# Patient Record
Sex: Female | Born: 1950 | Race: White | Hispanic: No | Marital: Married
Health system: Southern US, Community
[De-identification: ages and names within clinical notes are randomized; demographics above are authoritative.]

---

## 2006-10-29 ENCOUNTER — Emergency Department (HOSPITAL_COMMUNITY): Admission: EM | Admit: 2006-10-29 | Discharge: 2006-10-29 | Payer: Self-pay | Admitting: Family Medicine

## 2007-02-25 ENCOUNTER — Other Ambulatory Visit: Admission: RE | Admit: 2007-02-25 | Discharge: 2007-02-25 | Payer: Self-pay | Admitting: Family Medicine

## 2009-09-04 ENCOUNTER — Other Ambulatory Visit: Admission: RE | Admit: 2009-09-04 | Discharge: 2009-09-04 | Payer: Self-pay | Admitting: Family Medicine

## 2015-03-10 ENCOUNTER — Other Ambulatory Visit: Payer: Self-pay | Admitting: Family Medicine

## 2015-03-10 DIAGNOSIS — N838 Other noninflammatory disorders of ovary, fallopian tube and broad ligament: Secondary | ICD-10-CM

## 2015-03-16 ENCOUNTER — Ambulatory Visit
Admission: RE | Admit: 2015-03-16 | Discharge: 2015-03-16 | Disposition: A | Discharge status: Home / Self Care | Payer: BLUE CROSS/BLUE SHIELD | Source: Ambulatory Visit | Attending: Family Medicine | Admitting: Family Medicine

## 2015-03-16 DIAGNOSIS — N838 Other noninflammatory disorders of ovary, fallopian tube and broad ligament: Secondary | ICD-10-CM

## 2015-03-16 MED ORDER — IOPAMIDOL (ISOVUE-300) INJECTION 61%
100.0000 mL | Freq: Once | INTRAVENOUS | Status: AC | PRN
  Administered 2015-03-16: 100 mL via INTRAVENOUS

## 2015-03-17 ENCOUNTER — Other Ambulatory Visit: Payer: Self-pay

## 2016-11-08 IMAGING — CT CT ABD-PELV W/ CM
2 of 5 series · 15 of 46 positions shown, 17 images · IV contrast (APPLIED)
Comparison: Ultrasound of the pelvis of 03/09/2015

CLINICAL DATA: Ovarian mass noted on ultrasound, history of prior
pelvic surgery for uterine fibroids

EXAM:
CT ABDOMEN AND PELVIS WITH CONTRAST
TECHNIQUE: Multidetector CT imaging of the abdomen and pelvis was performed
using the standard protocol following bolus administration of
intravenous contrast.
CONTRAST:  100mL [id number] IOPAMIDOL (D6015S-6II) INJECTION 61%

[Series 2: abd pelvis 5.0 i41s 1 · axial · 0.78mm/px · z∈[-458,-28]mm · 12 of 98 slices shown, 14 images]
[im 6/98  soft-tissue]
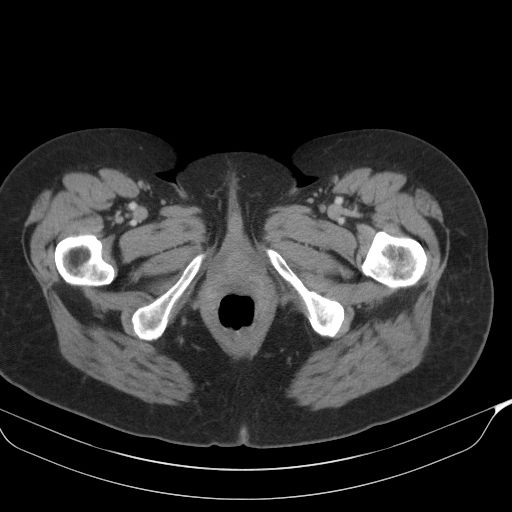
[im 6/98  bone]
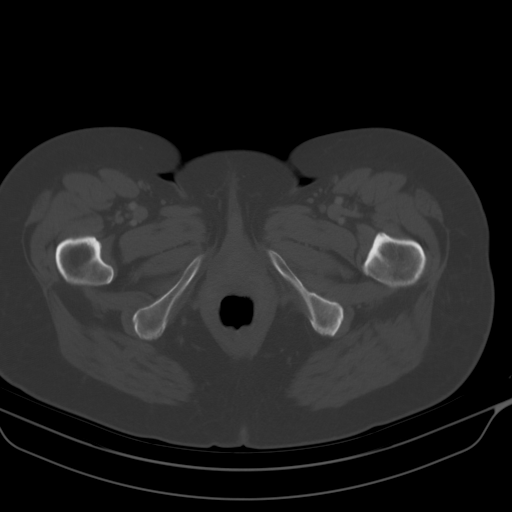
[im 16/98  soft-tissue]
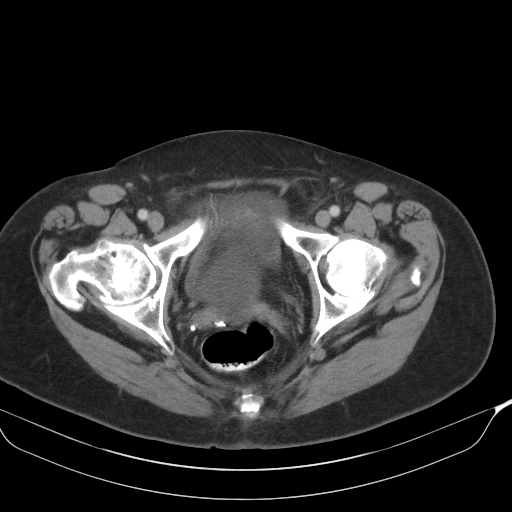
[im 21/98  soft-tissue]
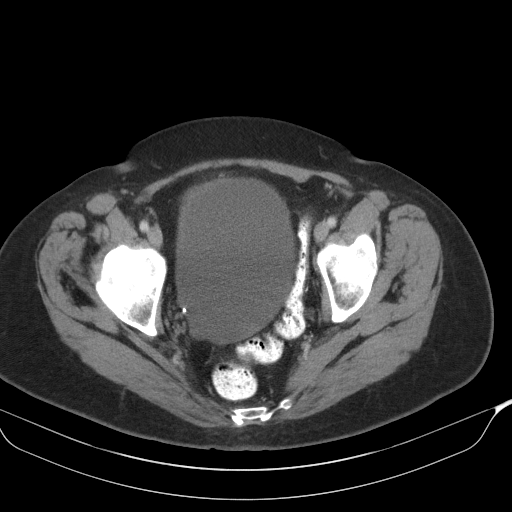
[im 31/98  soft-tissue]
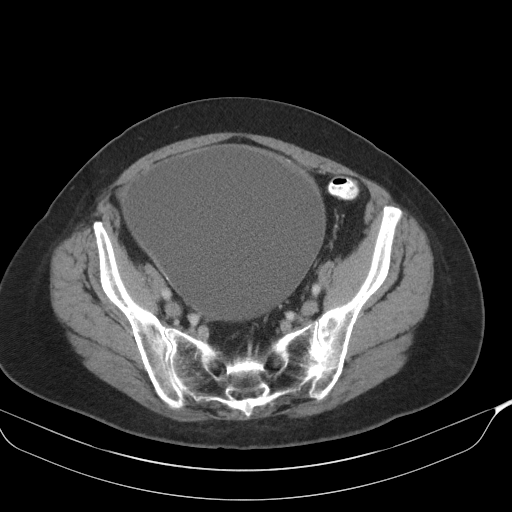
[im 36/98  soft-tissue]
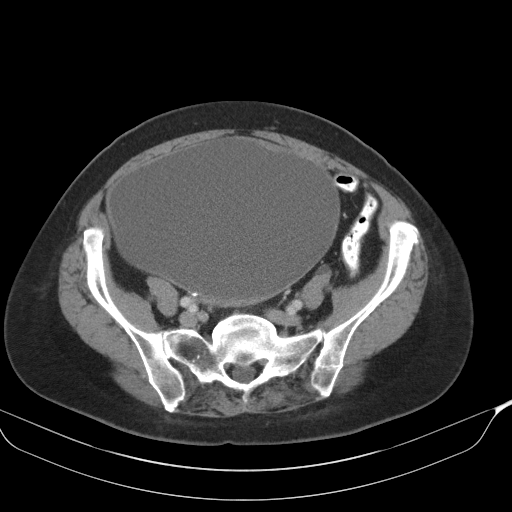
[im 46/98  soft-tissue]
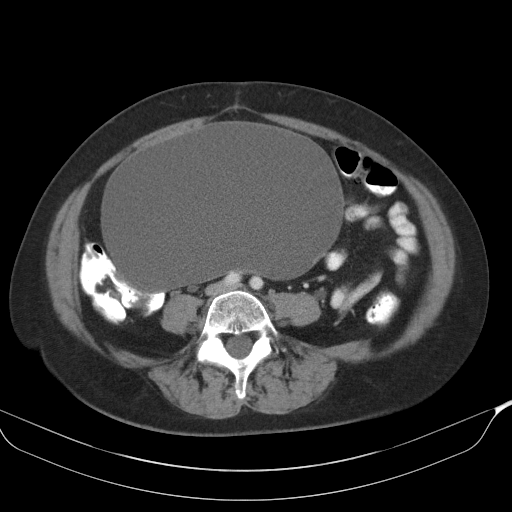
[im 52/98  soft-tissue]
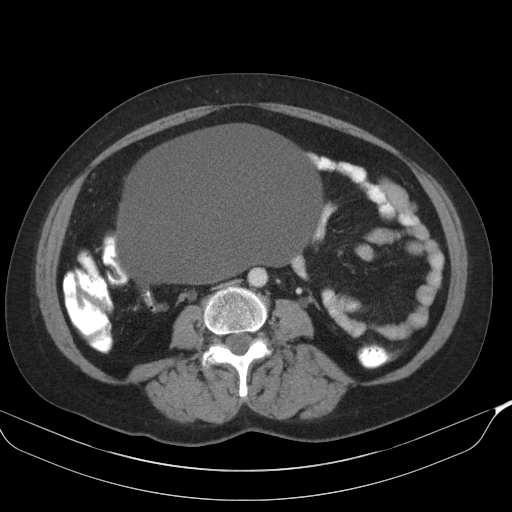
[im 62/98  soft-tissue]
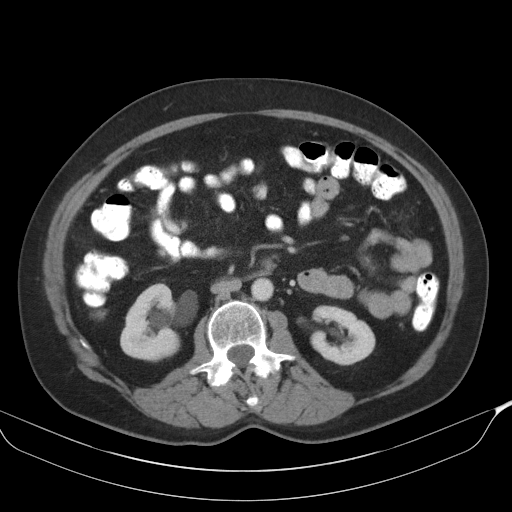
[im 67/98  soft-tissue]
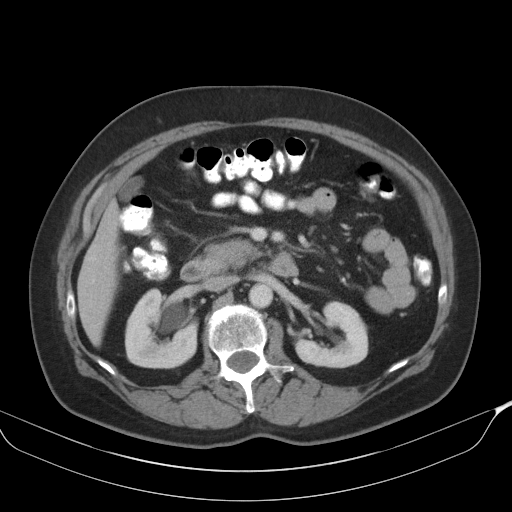
[im 67/98  bone]
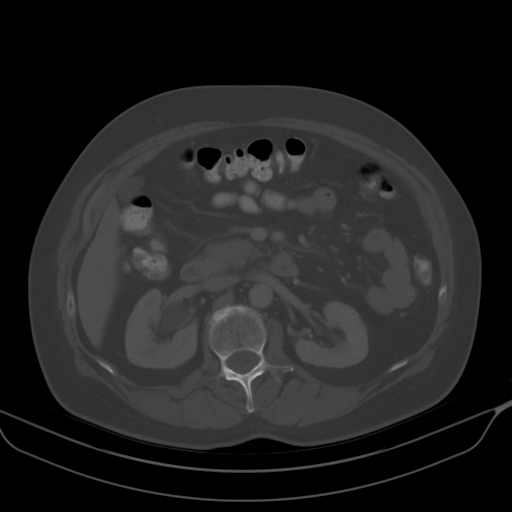
[im 77/98  soft-tissue]
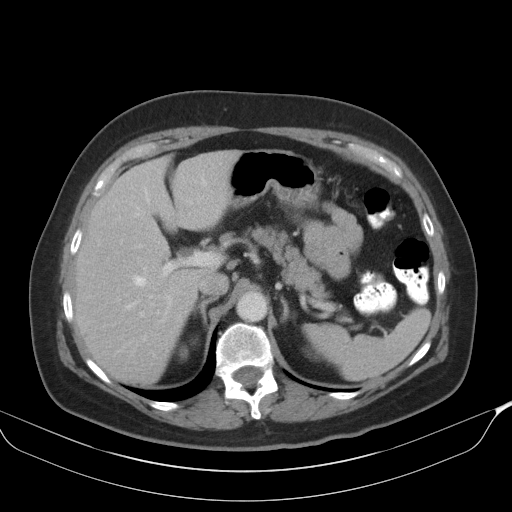
[im 82/98  soft-tissue]
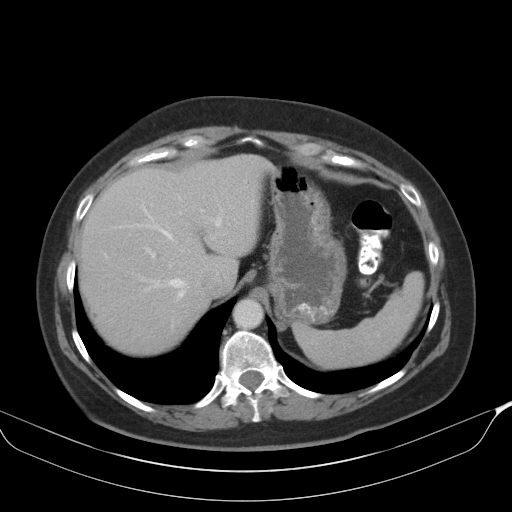
[im 92/98  soft-tissue]
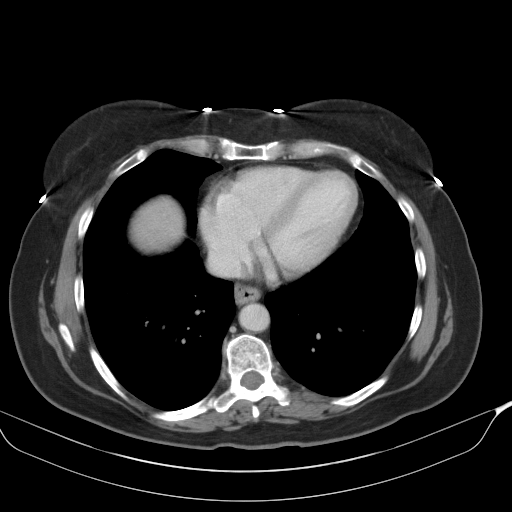

[Series 4: abd pelvis 3.0 spo cor · coronal · 0.78mm/px · 3 of 101 slices shown]
[im 34/101  soft-tissue]
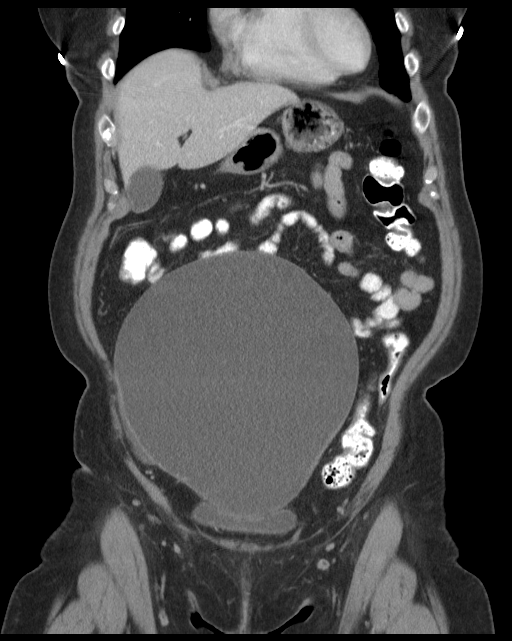
[im 45/101  soft-tissue]
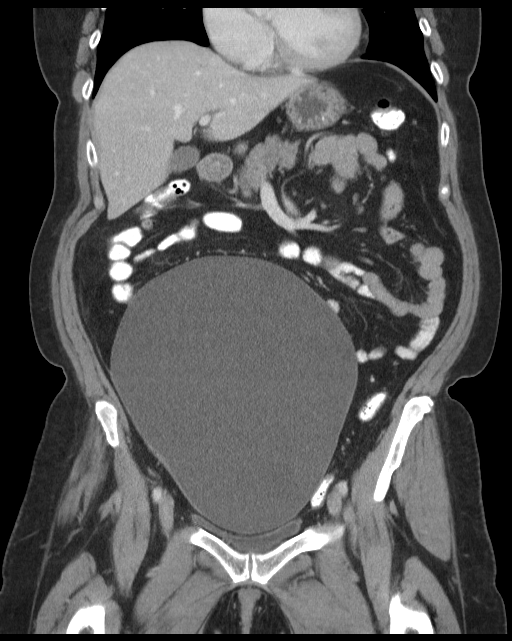
[im 56/101  soft-tissue]
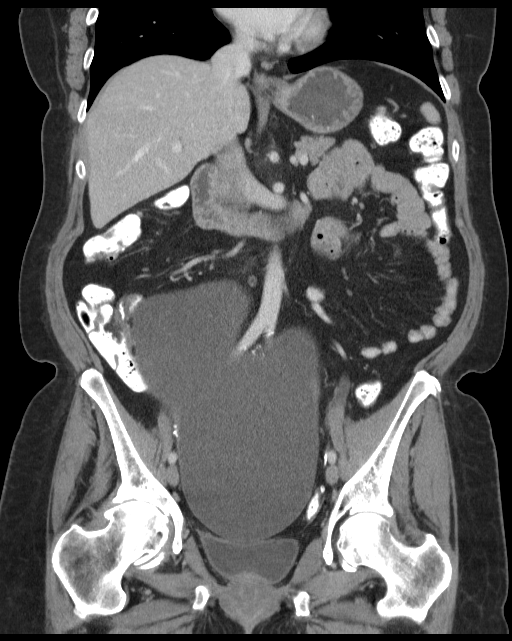

[15 of 46 positions shown; findings below may reference images not displayed]

FINDINGS: The lung bases are clear. There does appear to be a small hiatal
hernia present. The liver enhances and there are several sub cm
low-attenuation structures in both the right and left lobes most
consistent with cysts but too small to characterize. No calcified
gallstones are noted. The pancreas is normal in size and the
pancreatic duct is not dilated. The adrenal glands and spleen are
unremarkable. The stomach is not well distended but no abnormality
is evident. The kidneys enhance with no calculus or mass and on
delayed images, the pelvocaliceal systems are minimally prominent.
The ureters do appear to be prominent presumably due to extrinsic
compression by the large cystic pelvic mass. The abdominal aorta is
normal in caliber.

As noted on recent ultrasound of the pelvis there is a large cystic
mass within the mid pelvis probably emanating from the right adnexa.
This mass in its greatest diameter measures 19.4 x 11.6 x 21.5 cm.
The attenuation is largely that of water with attenuation of 7 HU.
There are a few areas along the wall of this cystic mass however
with slight thickening and calcification. A benign cystic ovarian
tumor is the primary consideration although with the slight
thickening of the wall of this primarily cystic lesion in several
areas an ovarian malignancy cannot be excluded and surgical excision
is recommended. This mass does compress the ureters resulting in
slight fullness of the ureters proximally. Also the mass compresses
the urinary bladder. The uterus has previously been resected. No
fluid is noted within the pelvis and there is no evidence of pelvic
adenopathy. There are scattered rectosigmoid colon diverticula
present. The colon is largely decompressed. The terminal ileum is
unremarkable. Degenerative joint disease is noted in the hips right
greater than left. The lumbar vertebrae are in normal alignment with
mild degenerative disc disease at L4-5 and L5-S1
IMPRESSION: 1. Large primarily cystic midline pelvic mass presumably emanating
from the right adnexa with minimal foci of wall thickening. This may
well represent a benign ovarian tumor, but ovarian malignancy cannot
be excluded. No adenopathy or metastatic disease is seen.
2. Scattered rectosigmoid colon diverticula.
3. Probable small hiatal hernia.
4. Degenerative joint disease of the hips right greater than left

## 2019-11-21 ENCOUNTER — Ambulatory Visit: Payer: Medicare Other | Attending: Internal Medicine

## 2019-11-21 DIAGNOSIS — Z23 Encounter for immunization: Secondary | ICD-10-CM | POA: Insufficient documentation

## 2019-11-21 NOTE — Progress Notes (Signed)
   Covid-19 Vaccination Clinic  Name:  Daisy Simmons    MRN: [id number] DOB: 1951-06-13  11/21/2019  [name] was observed post Covid-19 immunization for 15 minutes without incident. She was provided with Vaccine Information Sheet and instruction to access the V-Safe system.   [name] was instructed to call 911 with any severe reactions post vaccine: Marland Kitchen Difficulty breathing  . Swelling of face and throat  . A fast heartbeat  . A bad rash all over body  . Dizziness and weakness   Immunizations Administered    Name Date Dose VIS Date Route   Pfizer COVID-19 Vaccine 11/21/2019  9:52 AM 0.3 mL 08/27/2019 Intramuscular   Manufacturer: [employer], [employer]   Lot: [id number]   NDC: [address]-8

## 2019-12-15 ENCOUNTER — Ambulatory Visit: Payer: Medicare Other | Attending: Internal Medicine

## 2019-12-15 DIAGNOSIS — Z23 Encounter for immunization: Secondary | ICD-10-CM

## 2019-12-15 NOTE — Progress Notes (Signed)
   Covid-19 Vaccination Clinic  Name:  Daisy Simmons    MRN: [id number] DOB: 05-06-1951  12/15/2019  Ms. Droessler was observed post Covid-19 immunization for 15 minutes without incident. She was provided with Vaccine Information Sheet and instruction to access the V-Safe system.   Ms. Dwyer was instructed to call 911 with any severe reactions post vaccine: Marland Kitchen Difficulty breathing  . Swelling of face and throat  . A fast heartbeat  . A bad rash all over body  . Dizziness and weakness   Immunizations Administered    Name Date Dose VIS Date Route   Pfizer COVID-19 Vaccine 12/15/2019  8:09 AM 0.3 mL 08/27/2019 Intramuscular   Manufacturer: [employer], [employer]   Lot: [id number]   NDC: [address]-5

## 2020-12-13 DIAGNOSIS — Z1231 Encounter for screening mammogram for malignant neoplasm of breast: Secondary | ICD-10-CM | POA: Diagnosis not present
